# Patient Record
Sex: Female | Born: 1945 | Race: White | Hispanic: No
Health system: Southern US, Community
[De-identification: ages and names within clinical notes are randomized; demographics above are authoritative.]

## PROBLEM LIST (undated history)

## (undated) HISTORY — PX: AUGMENTATION MAMMAPLASTY: SUR837

---

## 1997-03-01 ENCOUNTER — Ambulatory Visit (HOSPITAL_COMMUNITY): Admission: RE | Admit: 1997-03-01 | Discharge: 1997-03-01 | Payer: Self-pay | Admitting: Obstetrics & Gynecology

## 1997-07-26 ENCOUNTER — Encounter: Admission: RE | Admit: 1997-07-26 | Discharge: 1997-10-24 | Payer: Self-pay | Admitting: Internal Medicine

## 1998-07-04 ENCOUNTER — Ambulatory Visit (HOSPITAL_COMMUNITY): Admission: RE | Admit: 1998-07-04 | Discharge: 1998-07-04 | Payer: Self-pay | Admitting: Internal Medicine

## 1998-07-04 ENCOUNTER — Encounter: Payer: Self-pay | Admitting: Internal Medicine

## 1998-07-08 ENCOUNTER — Encounter: Payer: Self-pay | Admitting: Internal Medicine

## 1998-07-08 ENCOUNTER — Ambulatory Visit (HOSPITAL_COMMUNITY): Admission: RE | Admit: 1998-07-08 | Discharge: 1998-07-08 | Payer: Self-pay | Admitting: Internal Medicine

## 1998-12-26 ENCOUNTER — Other Ambulatory Visit: Admission: RE | Admit: 1998-12-26 | Discharge: 1998-12-26 | Payer: Self-pay | Admitting: Obstetrics & Gynecology

## 1999-01-27 ENCOUNTER — Ambulatory Visit (HOSPITAL_COMMUNITY): Admission: RE | Admit: 1999-01-27 | Discharge: 1999-01-27 | Payer: Self-pay | Admitting: Internal Medicine

## 1999-01-27 ENCOUNTER — Encounter: Payer: Self-pay | Admitting: Internal Medicine

## 1999-04-25 ENCOUNTER — Emergency Department (HOSPITAL_COMMUNITY): Admission: EM | Admit: 1999-04-25 | Discharge: 1999-04-25 | Payer: Self-pay | Admitting: Emergency Medicine

## 1999-04-26 ENCOUNTER — Encounter: Payer: Self-pay | Admitting: Emergency Medicine

## 1999-05-22 ENCOUNTER — Ambulatory Visit (HOSPITAL_COMMUNITY): Admission: RE | Admit: 1999-05-22 | Discharge: 1999-05-22 | Payer: Self-pay | Admitting: Obstetrics & Gynecology

## 1999-05-22 ENCOUNTER — Encounter (INDEPENDENT_AMBULATORY_CARE_PROVIDER_SITE_OTHER): Payer: Self-pay | Admitting: Specialist

## 1999-07-25 ENCOUNTER — Encounter: Admission: RE | Admit: 1999-07-25 | Discharge: 1999-07-25 | Payer: Self-pay | Admitting: Internal Medicine

## 1999-07-25 ENCOUNTER — Encounter: Payer: Self-pay | Admitting: Internal Medicine

## 1999-08-29 ENCOUNTER — Other Ambulatory Visit: Admission: RE | Admit: 1999-08-29 | Discharge: 1999-08-29 | Payer: Self-pay | Admitting: Obstetrics & Gynecology

## 1999-08-29 ENCOUNTER — Encounter (INDEPENDENT_AMBULATORY_CARE_PROVIDER_SITE_OTHER): Payer: Self-pay | Admitting: Specialist

## 2000-01-17 ENCOUNTER — Other Ambulatory Visit: Admission: RE | Admit: 2000-01-17 | Discharge: 2000-01-17 | Payer: Self-pay | Admitting: Obstetrics & Gynecology

## 2000-08-07 ENCOUNTER — Ambulatory Visit (HOSPITAL_COMMUNITY): Admission: RE | Admit: 2000-08-07 | Discharge: 2000-08-07 | Payer: Self-pay | Admitting: Obstetrics & Gynecology

## 2000-08-07 ENCOUNTER — Encounter: Payer: Self-pay | Admitting: Obstetrics & Gynecology

## 2000-10-29 ENCOUNTER — Encounter: Admission: RE | Admit: 2000-10-29 | Discharge: 2000-10-29 | Payer: Self-pay | Admitting: Internal Medicine

## 2000-10-29 ENCOUNTER — Encounter: Payer: Self-pay | Admitting: Internal Medicine

## 2001-01-31 ENCOUNTER — Other Ambulatory Visit: Admission: RE | Admit: 2001-01-31 | Discharge: 2001-01-31 | Payer: Self-pay | Admitting: Obstetrics & Gynecology

## 2001-10-24 ENCOUNTER — Ambulatory Visit (HOSPITAL_COMMUNITY): Admission: RE | Admit: 2001-10-24 | Discharge: 2001-10-24 | Payer: Self-pay | Admitting: Obstetrics & Gynecology

## 2001-10-24 ENCOUNTER — Encounter: Payer: Self-pay | Admitting: Obstetrics & Gynecology

## 2002-01-20 ENCOUNTER — Other Ambulatory Visit: Admission: RE | Admit: 2002-01-20 | Discharge: 2002-01-20 | Payer: Self-pay | Admitting: Obstetrics & Gynecology

## 2002-10-27 ENCOUNTER — Ambulatory Visit (HOSPITAL_COMMUNITY): Admission: RE | Admit: 2002-10-27 | Discharge: 2002-10-27 | Payer: Self-pay | Admitting: Obstetrics & Gynecology

## 2002-10-27 ENCOUNTER — Encounter: Payer: Self-pay | Admitting: Obstetrics & Gynecology

## 2003-02-19 ENCOUNTER — Other Ambulatory Visit: Admission: RE | Admit: 2003-02-19 | Discharge: 2003-02-19 | Payer: Self-pay | Admitting: Obstetrics & Gynecology

## 2003-03-03 ENCOUNTER — Ambulatory Visit (HOSPITAL_BASED_OUTPATIENT_CLINIC_OR_DEPARTMENT_OTHER): Admission: RE | Admit: 2003-03-03 | Discharge: 2003-03-03 | Payer: Self-pay | Admitting: Orthopedic Surgery

## 2003-03-03 ENCOUNTER — Ambulatory Visit (HOSPITAL_COMMUNITY): Admission: RE | Admit: 2003-03-03 | Discharge: 2003-03-03 | Payer: Self-pay | Admitting: Orthopedic Surgery

## 2003-11-18 ENCOUNTER — Ambulatory Visit (HOSPITAL_COMMUNITY): Admission: RE | Admit: 2003-11-18 | Discharge: 2003-11-18 | Payer: Self-pay | Admitting: Obstetrics & Gynecology

## 2004-04-06 ENCOUNTER — Other Ambulatory Visit: Admission: RE | Admit: 2004-04-06 | Discharge: 2004-04-06 | Payer: Self-pay | Admitting: Obstetrics & Gynecology

## 2004-11-22 ENCOUNTER — Ambulatory Visit (HOSPITAL_COMMUNITY): Admission: RE | Admit: 2004-11-22 | Discharge: 2004-11-22 | Payer: Self-pay | Admitting: Obstetrics & Gynecology

## 2004-12-11 ENCOUNTER — Ambulatory Visit (HOSPITAL_COMMUNITY): Admission: RE | Admit: 2004-12-11 | Discharge: 2004-12-11 | Payer: Self-pay | Admitting: Plastic Surgery

## 2004-12-11 ENCOUNTER — Ambulatory Visit (HOSPITAL_BASED_OUTPATIENT_CLINIC_OR_DEPARTMENT_OTHER): Admission: RE | Admit: 2004-12-11 | Discharge: 2004-12-11 | Payer: Self-pay | Admitting: Plastic Surgery

## 2004-12-25 ENCOUNTER — Encounter: Admission: RE | Admit: 2004-12-25 | Discharge: 2004-12-25 | Payer: Self-pay | Admitting: Obstetrics & Gynecology

## 2005-05-01 ENCOUNTER — Other Ambulatory Visit: Admission: RE | Admit: 2005-05-01 | Discharge: 2005-05-01 | Payer: Self-pay | Admitting: Obstetrics & Gynecology

## 2005-12-24 ENCOUNTER — Encounter: Admission: RE | Admit: 2005-12-24 | Discharge: 2006-03-24 | Payer: Self-pay | Admitting: Family Medicine

## 2006-01-24 ENCOUNTER — Ambulatory Visit (HOSPITAL_COMMUNITY): Admission: RE | Admit: 2006-01-24 | Discharge: 2006-01-24 | Payer: Self-pay | Admitting: Obstetrics & Gynecology

## 2007-03-20 ENCOUNTER — Ambulatory Visit (HOSPITAL_COMMUNITY): Admission: RE | Admit: 2007-03-20 | Discharge: 2007-03-20 | Payer: Self-pay | Admitting: Obstetrics & Gynecology

## 2007-04-01 ENCOUNTER — Encounter: Admission: RE | Admit: 2007-04-01 | Discharge: 2007-04-01 | Payer: Self-pay | Admitting: Obstetrics & Gynecology

## 2008-04-15 ENCOUNTER — Encounter: Admission: RE | Admit: 2008-04-15 | Discharge: 2008-04-15 | Payer: Self-pay | Admitting: Obstetrics & Gynecology

## 2009-04-18 ENCOUNTER — Ambulatory Visit (HOSPITAL_COMMUNITY): Admission: RE | Admit: 2009-04-18 | Discharge: 2009-04-18 | Payer: Self-pay | Admitting: Family Medicine

## 2010-02-04 ENCOUNTER — Encounter: Payer: Self-pay | Admitting: Obstetrics & Gynecology

## 2010-02-05 ENCOUNTER — Encounter: Payer: Self-pay | Admitting: Obstetrics & Gynecology

## 2010-03-01 ENCOUNTER — Other Ambulatory Visit (HOSPITAL_COMMUNITY): Payer: Self-pay | Admitting: Family Medicine

## 2010-03-01 DIAGNOSIS — Z1231 Encounter for screening mammogram for malignant neoplasm of breast: Secondary | ICD-10-CM

## 2010-04-26 ENCOUNTER — Ambulatory Visit (HOSPITAL_COMMUNITY): Payer: Medicare Other

## 2010-05-04 ENCOUNTER — Other Ambulatory Visit: Payer: Self-pay | Admitting: Family Medicine

## 2010-05-04 DIAGNOSIS — Z1231 Encounter for screening mammogram for malignant neoplasm of breast: Secondary | ICD-10-CM

## 2010-05-09 ENCOUNTER — Ambulatory Visit (HOSPITAL_COMMUNITY): Payer: Medicare Other

## 2010-05-17 ENCOUNTER — Ambulatory Visit: Payer: Medicare Other

## 2010-05-23 ENCOUNTER — Ambulatory Visit
Admission: RE | Admit: 2010-05-23 | Discharge: 2010-05-23 | Disposition: A | Discharge status: Home / Self Care | Payer: Medicare Other | Source: Ambulatory Visit | Attending: Family Medicine | Admitting: Family Medicine

## 2010-05-23 DIAGNOSIS — Z1231 Encounter for screening mammogram for malignant neoplasm of breast: Secondary | ICD-10-CM

## 2010-06-02 NOTE — Op Note (Signed)
[facility]  Patient:    Lauren Kerr, Lauren Kerr                       MRN: [id number] Proc. Date: 05/22/99 Adm. Date:  [id number] Attending:  Minette Headland                           Operative Report  PREOPERATIVE DIAGNOSIS:       Uterine fibroids, endometrial thickening on pelvic ultrasound, postmenopausal bleeding on hormone replacement therapy.  POSTOPERATIVE DIAGNOSIS:      Submucosal fibroids broad based not easily resectable and endometrial polyps.  OPERATION:                    Hysteroscopy, D&C.  SURGEON:                      Freddy Finner, M.D.  ASSISTANT:  ANESTHESIA:                   General anesthesia.  COMPLICATIONS:                None.  ESTIMATED BLOOD LOSS:         10 cc.  SORBITOL:                     Deficit 90 cc.  INDICATIONS:                  The patient is a 65 year old, gravida 3, para 3, ho has been known to have uterine leiomyomata and had an episode of postmenopausal  bleeding on hormone replacement therapy.  Pelvic ultrasound suggested submucous  fibroids, but also showed thickened endometrium to 1.2 cm.  She is admitted now for hysteroscopy and D&C.  FINDINGS:                     Did reveal an irregularity of the internal contour of the uterine cavity with a broad based area anteriorly consistent with a polyp and with a submucous fibroid.  The polyp was broad based and thickened to approximately 1 cm.  Posteriorly there appeared to be an irregularity on the posterior fundus. There was also a small polyp in that location.  Thorough curettage, explortion ith Randall stone forceps, and repeat curettage did adequately evacuate the polyps nd reduce the size of the irregularities noted on hysteroscopy.  DESCRIPTION OF PROCEDURE:     The patient was brought to the operating room and  placed under adequate general anesthesia.  She was placed in the dorsal lithotomy position using the [employer] system.   Betadine prep was carried out in the usual fashion.  Sterile drapes were applied.  The 12 degree [facility] hysteroscope was used, using Sorbitol as the distending medium.  The cervix was progressively dilated to 25 with Pratts.  Hysteroscope was introduced and careful inspection revealed findings as noted above.  Thorough curettage was carried out with Heaney curet and with standard sharp curet and the uterine cavity was explored with Randall stone forceps.  Tissue was obtained for histologic examination.  On reinspection revealed adequate sampling and reduction in the size of the lesions noted.  The procedure at this point was terminated.  All instruments were removed. The patient was taken to the recovery room in good condition and will be discharged in the immediate postoperative period with routine outpatient surgical instructions.  She is to use ibuprofen as needed for cramping pain. DD:  05/22/99 TD:  05/23/99 Job: [id number]

## 2010-06-02 NOTE — Op Note (Signed)
NAME:  Lauren Kerr, Lauren Kerr                          ACCOUNT NO.:  [id number]   MEDICAL RECORD NO.:  [id number]                   PATIENT TYPE:  AMB   LOCATION:  DSC                                  FACILITY:  [facility]   PHYSICIAN:  Harvie Junior, M.D.                DATE OF BIRTH:  May 18, 1945   DATE OF PROCEDURE:  03/03/2003  DATE OF DISCHARGE:                                 OPERATIVE REPORT   PREOPERATIVE DIAGNOSIS:  Stiff left shoulder with impingement.   POSTOPERATIVE DIAGNOSIS:  Stiff left shoulder with impingement.   PROCEDURE:  Anterolateral acromioplasty with manipulation under anesthesia.   SURGEON:  Harvie Junior, M.D.   ASSISTANT:  Marshia Ly, P.A.   ANESTHESIA:  General.   INDICATIONS FOR PROCEDURE:  A 64 year old female with a long history of  having left shoulder pain and locking and freezing up.  Ultimately had taken  her through physical therapy and anti-inflammatory medications.  She failed  all conservative care and with continued complaints of pain, she was  ultimately taken to the operating room for manipulation under anesthesia and  arthroscopy.   DESCRIPTION OF PROCEDURE:  The patient was taken to the operating room and  after adequate anesthesia was obtained with general endotracheal, the  patient was placed supine on the table. The left arm was examined under  anesthesia and noted to be stiff.  We manipulated her to 170 degrees  elevation to the back of her neck and also an external rotation to about 60  degrees.  At this point, she was prepped and draped in the usual sterile  fashion.  Following this, routine arthroscopic examination of the shoulder  revealed that there was tearing of the structures within the glenohumeral  joint and no other significant abnormality.  Rotator cuff looked good.  At  this point, attention was turned to the subacromial space where an  anterolateral acromioplasty was performed through a lateral and posterior  incision.  At  this point, the shoulder was copiously irrigated and suctioned  dry.  The arthroscopic portals were closed with a bandage.  A sterile  compression dressing was applied.  The patient was taken to the recovery  room where she was noted to be in satisfactory condition.  Estimated blood  loss for the procedure was none.                                               Harvie Junior, M.D.    Ranae Plumber  D:  03/03/2003  T:  03/04/2003  Job:  [id number]

## 2010-06-02 NOTE — Op Note (Signed)
Lauren Kerr, Lauren Kerr                ACCOUNT NO.:  [id number]   MEDICAL RECORD NO.:  [id number]          PATIENT TYPE:  AMB   LOCATION:  DSC                          FACILITY:  [facility]   PHYSICIAN:  Alfredia Ferguson, M.D.  DATE OF BIRTH:  1945/06/08   DATE OF PROCEDURE:  12/11/2004  DATE OF DISCHARGE:                                 OPERATIVE REPORT   PREOPERATIVE DIAGNOSES:  1.  Excess fat, submental and bilateral neck areas.  2.  Dermatochalasia of upper eyelid.  3.  Visual field loss secondary to number two.  4.  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   POSTOPERATIVE DIAGNOSES:  1.  Excess fat, submental and bilateral neck areas.  2.  Dermatochalasia of upper eyelid.  3.  Visual field loss secondary to number two.  4.  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   OPERATION PERFORMED:   SURGEON:  Alfredia Ferguson, MD.   ANESTHESIA:  General endotracheal anesthesia.   INDICATIONS FOR SURGERY:  This 65 year old woman complains of excess fat in  her neck.  She wishes to have the fat removed by suction.  She understands  the risk of the skin not contracting and leaving with her excess, redundant  skin in the area.  She also understands the limitations of liposuction in  someone over 40 with the possibility of rippling, dimpling, and wrinkling of  the skin being at greater likelihood.  The patient also complains of visual  field loss secondary to dermatochalasia of her upper eyelid.  She wishes to  undergo elective upper and lower lid blepharoplasty.  Potential risk of  blepharoplasty including blindness, bleeding, hematoma, asymmetry, unsightly  scarring, ectropion, the need for redo surgery, and overall dissatisfaction  of the results were discussed with the patient.  In spite of these and other  risks discussed, the patient wishes to proceed with the surgery.   DESCRIPTION OF  OPERATION:  Skin markers were placed outlining the dimensions  of the excess skin of the upper eyelids.  Skin markers were also placed  along the subciliary margin in both lower eyelids.  The limits of my  liposuction were marked prior to the patient being taken to the OR.  Patient  was then taken to the OR where she was given general endotracheal  anesthesia.  Her face and neck were prepped with Betadine and draped with  sterile drapes.  A tumescent solution was infiltrated in the submental area  in both sides of the neck. A 150 mL of this tumescent solution was used.  This solution consisted of 250 mL of normal saline plus 1 ampule of  epinephrine 1:1000 plus 50 mL of 1% Xylocaine plain.  1% Xylocaine and  1:1000 epinephrine were also infiltrated in both the upper and lower eyelid  areas.  Using a combination of a 3-mm and a 3.4-mm cannula, liposuction was  carried out first in the submental area and then in both the lateral necks.  The access was  a small incision in the submental area and one small incision  behind each earlobe.  75 gm of separated fat was removed.  There was  essentially no bleeding.  The patient had a nice clinical response.  There  was noted to be fat continuing on down lower in her neck, but I opted not to  continue to chase it any farther than what I preoperatively marked her.  Attention was now directed to the right upper eyelid.  An elliptical skin  excision in the upper lid was carried out.  A narrow strip of orbicularis  muscle was then excised.  Hemostasis was meticulously maintained.  The  septum orbitale was opened medially and in the middle, and a moderate amount  of fat was teased out of its anatomic bed.  The fat was clamped at the base,  excised, and the base was cauterized.  A small tunnel was then created  underneath the orbicularis in the lateral orbital rim area through the upper  eyelid incision.  This was tunneled for about a centimeter.  The  lateral  limb of my lower eyelid incision was now incised.  A skin flap was elevated  in the subciliary area.  A skin flap was elevated for 5 to 6 mm.  At this  point, the depth of my incision went underneath the orbicularis muscle.  This skin muscle flap continued down to the inferior orbital rim.  The  lateral corner of the orbicularis was incised in order to allow me to  elevate the flap to the desired degree.  A large amount of fat was  visualized in the lower eyelid.  The septum orbitale was opened medially in  the middle and in the lateral pocket, and fat was teased out of its anatomic  bed.  In each area, the fat was clamped at the base, excised, and the base  was cauterized.  The wound was irrigated with saline irrigation.  The skin  muscle flap was elevated in a superior direction, and a conservative skin  excision was carried out.  A small pennant of muscle was removed from the  undersurface of the lateral corner of my lower lid incision.  I connected  the lower eyelid incision with the lateral tunnel, which I created from the  upper eyelid.  This small pennant of muscle was then pulled up through this  tunnel and was tacked down to the lateral orbital rim with an interrupted 4-  0 Vicryl suture.  This was placed to support the lower lid.  Some small  revision was then carried out in the lower eyelid, removing an extra  millimeter of skin.  Before closing either of my incisions, attention was  directed to the left side where an identical procedure was performed.  Once  I got to the same point on the left side as I was on the right, I returned  to the right side where both incisions were irrigated with saline  irrigation.  The upper eyelid incision was closed with several interrupted 6-  0 nylon sutures spaced approximately a centimeter or two apart.  A running 5-  0 plain gut was used to close the upper eyelid incision in its entirety. The lateral corner of my lower eyelid  incision was closed with interrupted 6-  0 silk sutures.  The remainder of the medial three-fourths of the incision  was closed with a running 5-0 fast-absorbing gut.  Attention was redirected  to the left side where  it was irrigated, and closure was carried out in an  identical fashion.  The patient tolerated the procedure well with almost no  bleeding.  Each access site in the neck was now closed with interrupted 5-0  nylon suture.  The patient had two small skin tags removed from each  axillary area at her request.  They were simply snipped off at the base and  a Band-Aid was placed.  The patient was awakened, extubated, and transported  to the recovery room in satisfactory condition.      Alfredia Ferguson, M.D.  Electronically Signed     WBB/MEDQ  D:  12/11/2004  T:  12/12/2004  Job:  [id number]

## 2011-03-09 DIAGNOSIS — M19049 Primary osteoarthritis, unspecified hand: Secondary | ICD-10-CM | POA: Diagnosis not present

## 2011-03-15 DIAGNOSIS — E7800 Pure hypercholesterolemia, unspecified: Secondary | ICD-10-CM | POA: Diagnosis not present

## 2011-03-15 DIAGNOSIS — E1149 Type 2 diabetes mellitus with other diabetic neurological complication: Secondary | ICD-10-CM | POA: Diagnosis not present

## 2011-03-15 DIAGNOSIS — I10 Essential (primary) hypertension: Secondary | ICD-10-CM | POA: Diagnosis not present

## 2011-03-15 DIAGNOSIS — G609 Hereditary and idiopathic neuropathy, unspecified: Secondary | ICD-10-CM | POA: Diagnosis not present

## 2011-04-24 DIAGNOSIS — N950 Postmenopausal bleeding: Secondary | ICD-10-CM | POA: Diagnosis not present

## 2011-04-24 DIAGNOSIS — N949 Unspecified condition associated with female genital organs and menstrual cycle: Secondary | ICD-10-CM | POA: Diagnosis not present

## 2011-04-24 DIAGNOSIS — N8500 Endometrial hyperplasia, unspecified: Secondary | ICD-10-CM | POA: Diagnosis not present

## 2011-04-26 DIAGNOSIS — N950 Postmenopausal bleeding: Secondary | ICD-10-CM | POA: Diagnosis not present

## 2011-04-26 DIAGNOSIS — R9389 Abnormal findings on diagnostic imaging of other specified body structures: Secondary | ICD-10-CM | POA: Diagnosis not present

## 2011-04-26 DIAGNOSIS — Z79890 Hormone replacement therapy (postmenopausal): Secondary | ICD-10-CM | POA: Diagnosis not present

## 2011-04-26 DIAGNOSIS — D260 Other benign neoplasm of cervix uteri: Secondary | ICD-10-CM | POA: Diagnosis not present

## 2011-05-01 DIAGNOSIS — M19049 Primary osteoarthritis, unspecified hand: Secondary | ICD-10-CM | POA: Diagnosis not present

## 2011-05-09 ENCOUNTER — Other Ambulatory Visit (HOSPITAL_COMMUNITY): Payer: Self-pay | Admitting: Family Medicine

## 2011-05-09 DIAGNOSIS — Z1231 Encounter for screening mammogram for malignant neoplasm of breast: Secondary | ICD-10-CM

## 2011-06-06 ENCOUNTER — Ambulatory Visit
Admission: RE | Admit: 2011-06-06 | Discharge: 2011-06-06 | Disposition: A | Discharge status: Home / Self Care | Payer: Medicare Other | Source: Ambulatory Visit | Attending: Family Medicine | Admitting: Family Medicine

## 2011-06-06 DIAGNOSIS — Z1231 Encounter for screening mammogram for malignant neoplasm of breast: Secondary | ICD-10-CM

## 2011-07-31 DIAGNOSIS — H6120 Impacted cerumen, unspecified ear: Secondary | ICD-10-CM | POA: Diagnosis not present

## 2011-07-31 DIAGNOSIS — E1149 Type 2 diabetes mellitus with other diabetic neurological complication: Secondary | ICD-10-CM | POA: Diagnosis not present

## 2011-08-16 DIAGNOSIS — H259 Unspecified age-related cataract: Secondary | ICD-10-CM | POA: Diagnosis not present

## 2011-08-16 DIAGNOSIS — E119 Type 2 diabetes mellitus without complications: Secondary | ICD-10-CM | POA: Diagnosis not present

## 2011-08-16 DIAGNOSIS — Z961 Presence of intraocular lens: Secondary | ICD-10-CM | POA: Diagnosis not present

## 2011-08-16 DIAGNOSIS — H5231 Anisometropia: Secondary | ICD-10-CM | POA: Diagnosis not present

## 2011-08-28 DIAGNOSIS — D692 Other nonthrombocytopenic purpura: Secondary | ICD-10-CM | POA: Diagnosis not present

## 2011-08-28 DIAGNOSIS — L570 Actinic keratosis: Secondary | ICD-10-CM | POA: Diagnosis not present

## 2011-08-28 DIAGNOSIS — L821 Other seborrheic keratosis: Secondary | ICD-10-CM | POA: Diagnosis not present

## 2011-08-28 DIAGNOSIS — Z85828 Personal history of other malignant neoplasm of skin: Secondary | ICD-10-CM | POA: Diagnosis not present

## 2011-09-12 DIAGNOSIS — G609 Hereditary and idiopathic neuropathy, unspecified: Secondary | ICD-10-CM | POA: Diagnosis not present

## 2011-09-12 DIAGNOSIS — Z0000 Encounter for general adult medical examination without abnormal findings: Secondary | ICD-10-CM | POA: Diagnosis not present

## 2011-09-12 DIAGNOSIS — F329 Major depressive disorder, single episode, unspecified: Secondary | ICD-10-CM | POA: Diagnosis not present

## 2011-09-12 DIAGNOSIS — E7800 Pure hypercholesterolemia, unspecified: Secondary | ICD-10-CM | POA: Diagnosis not present

## 2011-09-12 DIAGNOSIS — I10 Essential (primary) hypertension: Secondary | ICD-10-CM | POA: Diagnosis not present

## 2011-09-12 DIAGNOSIS — J309 Allergic rhinitis, unspecified: Secondary | ICD-10-CM | POA: Diagnosis not present

## 2011-09-12 DIAGNOSIS — E1149 Type 2 diabetes mellitus with other diabetic neurological complication: Secondary | ICD-10-CM | POA: Diagnosis not present

## 2011-10-08 DIAGNOSIS — Z01419 Encounter for gynecological examination (general) (routine) without abnormal findings: Secondary | ICD-10-CM | POA: Diagnosis not present

## 2011-10-30 DIAGNOSIS — Z780 Asymptomatic menopausal state: Secondary | ICD-10-CM | POA: Diagnosis not present

## 2011-11-08 DIAGNOSIS — M2550 Pain in unspecified joint: Secondary | ICD-10-CM | POA: Diagnosis not present

## 2011-11-08 DIAGNOSIS — R5381 Other malaise: Secondary | ICD-10-CM | POA: Diagnosis not present

## 2011-11-08 DIAGNOSIS — M25569 Pain in unspecified knee: Secondary | ICD-10-CM | POA: Diagnosis not present

## 2011-11-08 DIAGNOSIS — R5383 Other fatigue: Secondary | ICD-10-CM | POA: Diagnosis not present

## 2011-11-08 DIAGNOSIS — Z79899 Other long term (current) drug therapy: Secondary | ICD-10-CM | POA: Diagnosis not present

## 2011-11-08 DIAGNOSIS — M25549 Pain in joints of unspecified hand: Secondary | ICD-10-CM | POA: Diagnosis not present

## 2011-11-29 DIAGNOSIS — M19049 Primary osteoarthritis, unspecified hand: Secondary | ICD-10-CM | POA: Diagnosis not present

## 2011-11-29 DIAGNOSIS — M1710 Unilateral primary osteoarthritis, unspecified knee: Secondary | ICD-10-CM | POA: Diagnosis not present

## 2012-04-01 DIAGNOSIS — N949 Unspecified condition associated with female genital organs and menstrual cycle: Secondary | ICD-10-CM | POA: Diagnosis not present

## 2012-05-06 DIAGNOSIS — N800 Endometriosis of uterus: Secondary | ICD-10-CM | POA: Diagnosis not present

## 2012-05-06 DIAGNOSIS — N949 Unspecified condition associated with female genital organs and menstrual cycle: Secondary | ICD-10-CM | POA: Diagnosis not present

## 2012-05-14 ENCOUNTER — Other Ambulatory Visit: Payer: Self-pay

## 2012-05-14 DIAGNOSIS — Z1231 Encounter for screening mammogram for malignant neoplasm of breast: Secondary | ICD-10-CM

## 2012-05-19 DIAGNOSIS — D279 Benign neoplasm of unspecified ovary: Secondary | ICD-10-CM | POA: Diagnosis not present

## 2012-05-19 DIAGNOSIS — N949 Unspecified condition associated with female genital organs and menstrual cycle: Secondary | ICD-10-CM | POA: Diagnosis not present

## 2012-05-19 DIAGNOSIS — N838 Other noninflammatory disorders of ovary, fallopian tube and broad ligament: Secondary | ICD-10-CM | POA: Diagnosis not present

## 2012-05-19 DIAGNOSIS — D282 Benign neoplasm of uterine tubes and ligaments: Secondary | ICD-10-CM | POA: Diagnosis not present

## 2012-05-19 DIAGNOSIS — N736 Female pelvic peritoneal adhesions (postinfective): Secondary | ICD-10-CM | POA: Diagnosis not present

## 2012-05-19 DIAGNOSIS — N9489 Other specified conditions associated with female genital organs and menstrual cycle: Secondary | ICD-10-CM | POA: Diagnosis not present

## 2012-05-19 DIAGNOSIS — N800 Endometriosis of uterus: Secondary | ICD-10-CM | POA: Diagnosis not present

## 2012-05-19 DIAGNOSIS — N840 Polyp of corpus uteri: Secondary | ICD-10-CM | POA: Diagnosis not present

## 2012-05-19 DIAGNOSIS — D259 Leiomyoma of uterus, unspecified: Secondary | ICD-10-CM | POA: Diagnosis not present

## 2012-05-28 DIAGNOSIS — M1710 Unilateral primary osteoarthritis, unspecified knee: Secondary | ICD-10-CM | POA: Diagnosis not present

## 2012-05-28 DIAGNOSIS — M4000 Postural kyphosis, site unspecified: Secondary | ICD-10-CM | POA: Diagnosis not present

## 2012-05-28 DIAGNOSIS — M19049 Primary osteoarthritis, unspecified hand: Secondary | ICD-10-CM | POA: Diagnosis not present

## 2012-06-26 DIAGNOSIS — Z09 Encounter for follow-up examination after completed treatment for conditions other than malignant neoplasm: Secondary | ICD-10-CM | POA: Diagnosis not present

## 2012-07-28 ENCOUNTER — Ambulatory Visit
Admission: RE | Admit: 2012-07-28 | Discharge: 2012-07-28 | Disposition: A | Discharge status: Home / Self Care | Payer: Medicare Other | Source: Ambulatory Visit

## 2012-07-28 DIAGNOSIS — Z1231 Encounter for screening mammogram for malignant neoplasm of breast: Secondary | ICD-10-CM

## 2012-08-28 DIAGNOSIS — H43819 Vitreous degeneration, unspecified eye: Secondary | ICD-10-CM | POA: Diagnosis not present

## 2012-08-28 DIAGNOSIS — H259 Unspecified age-related cataract: Secondary | ICD-10-CM | POA: Diagnosis not present

## 2012-08-28 DIAGNOSIS — E119 Type 2 diabetes mellitus without complications: Secondary | ICD-10-CM | POA: Diagnosis not present

## 2012-08-28 DIAGNOSIS — H5231 Anisometropia: Secondary | ICD-10-CM | POA: Diagnosis not present

## 2012-09-23 DIAGNOSIS — J309 Allergic rhinitis, unspecified: Secondary | ICD-10-CM | POA: Diagnosis not present

## 2012-09-23 DIAGNOSIS — I10 Essential (primary) hypertension: Secondary | ICD-10-CM | POA: Diagnosis not present

## 2012-09-23 DIAGNOSIS — E1149 Type 2 diabetes mellitus with other diabetic neurological complication: Secondary | ICD-10-CM | POA: Diagnosis not present

## 2012-09-23 DIAGNOSIS — G609 Hereditary and idiopathic neuropathy, unspecified: Secondary | ICD-10-CM | POA: Diagnosis not present

## 2012-09-23 DIAGNOSIS — Z0000 Encounter for general adult medical examination without abnormal findings: Secondary | ICD-10-CM | POA: Diagnosis not present

## 2012-09-23 DIAGNOSIS — F329 Major depressive disorder, single episode, unspecified: Secondary | ICD-10-CM | POA: Diagnosis not present

## 2012-09-23 DIAGNOSIS — Z23 Encounter for immunization: Secondary | ICD-10-CM | POA: Diagnosis not present

## 2012-09-23 DIAGNOSIS — E7800 Pure hypercholesterolemia, unspecified: Secondary | ICD-10-CM | POA: Diagnosis not present

## 2012-10-01 DIAGNOSIS — L821 Other seborrheic keratosis: Secondary | ICD-10-CM | POA: Diagnosis not present

## 2012-10-01 DIAGNOSIS — L570 Actinic keratosis: Secondary | ICD-10-CM | POA: Diagnosis not present

## 2012-10-01 DIAGNOSIS — Z85828 Personal history of other malignant neoplasm of skin: Secondary | ICD-10-CM | POA: Diagnosis not present

## 2012-10-09 DIAGNOSIS — R21 Rash and other nonspecific skin eruption: Secondary | ICD-10-CM | POA: Diagnosis not present

## 2012-11-25 DIAGNOSIS — M1710 Unilateral primary osteoarthritis, unspecified knee: Secondary | ICD-10-CM | POA: Diagnosis not present

## 2012-11-25 DIAGNOSIS — M6530 Trigger finger, unspecified finger: Secondary | ICD-10-CM | POA: Diagnosis not present

## 2012-11-25 DIAGNOSIS — M19049 Primary osteoarthritis, unspecified hand: Secondary | ICD-10-CM | POA: Diagnosis not present

## 2012-11-25 DIAGNOSIS — M4000 Postural kyphosis, site unspecified: Secondary | ICD-10-CM | POA: Diagnosis not present

## 2012-12-23 DIAGNOSIS — E119 Type 2 diabetes mellitus without complications: Secondary | ICD-10-CM | POA: Diagnosis not present

## 2013-03-24 DIAGNOSIS — G609 Hereditary and idiopathic neuropathy, unspecified: Secondary | ICD-10-CM | POA: Diagnosis not present

## 2013-03-24 DIAGNOSIS — E1149 Type 2 diabetes mellitus with other diabetic neurological complication: Secondary | ICD-10-CM | POA: Diagnosis not present

## 2013-03-24 DIAGNOSIS — I10 Essential (primary) hypertension: Secondary | ICD-10-CM | POA: Diagnosis not present

## 2013-03-24 DIAGNOSIS — Z23 Encounter for immunization: Secondary | ICD-10-CM | POA: Diagnosis not present

## 2013-03-24 DIAGNOSIS — E7800 Pure hypercholesterolemia, unspecified: Secondary | ICD-10-CM | POA: Diagnosis not present

## 2013-07-01 DIAGNOSIS — Z124 Encounter for screening for malignant neoplasm of cervix: Secondary | ICD-10-CM | POA: Diagnosis not present

## 2013-07-01 DIAGNOSIS — Z1212 Encounter for screening for malignant neoplasm of rectum: Secondary | ICD-10-CM | POA: Diagnosis not present

## 2013-08-28 ENCOUNTER — Other Ambulatory Visit: Payer: Self-pay

## 2013-08-28 DIAGNOSIS — Z1231 Encounter for screening mammogram for malignant neoplasm of breast: Secondary | ICD-10-CM

## 2013-08-28 DIAGNOSIS — Z9882 Breast implant status: Secondary | ICD-10-CM

## 2013-09-02 DIAGNOSIS — E119 Type 2 diabetes mellitus without complications: Secondary | ICD-10-CM | POA: Diagnosis not present

## 2013-09-02 DIAGNOSIS — H259 Unspecified age-related cataract: Secondary | ICD-10-CM | POA: Diagnosis not present

## 2013-09-02 DIAGNOSIS — Z961 Presence of intraocular lens: Secondary | ICD-10-CM | POA: Diagnosis not present

## 2013-09-02 DIAGNOSIS — H52209 Unspecified astigmatism, unspecified eye: Secondary | ICD-10-CM | POA: Diagnosis not present

## 2013-09-04 ENCOUNTER — Ambulatory Visit
Admission: RE | Admit: 2013-09-04 | Discharge: 2013-09-04 | Disposition: A | Discharge status: Home / Self Care | Payer: Medicare Other | Source: Ambulatory Visit

## 2013-09-04 ENCOUNTER — Encounter (INDEPENDENT_AMBULATORY_CARE_PROVIDER_SITE_OTHER): Payer: Self-pay

## 2013-09-04 DIAGNOSIS — Z1231 Encounter for screening mammogram for malignant neoplasm of breast: Secondary | ICD-10-CM

## 2013-09-04 DIAGNOSIS — Z9882 Breast implant status: Secondary | ICD-10-CM

## 2013-10-21 DIAGNOSIS — D229 Melanocytic nevi, unspecified: Secondary | ICD-10-CM | POA: Diagnosis not present

## 2013-10-21 DIAGNOSIS — L814 Other melanin hyperpigmentation: Secondary | ICD-10-CM | POA: Diagnosis not present

## 2013-10-21 DIAGNOSIS — Z85828 Personal history of other malignant neoplasm of skin: Secondary | ICD-10-CM | POA: Diagnosis not present

## 2013-10-21 DIAGNOSIS — L821 Other seborrheic keratosis: Secondary | ICD-10-CM | POA: Diagnosis not present

## 2013-10-27 DIAGNOSIS — H25011 Cortical age-related cataract, right eye: Secondary | ICD-10-CM | POA: Diagnosis not present

## 2013-10-27 DIAGNOSIS — H25041 Posterior subcapsular polar age-related cataract, right eye: Secondary | ICD-10-CM | POA: Diagnosis not present

## 2013-10-27 DIAGNOSIS — H52201 Unspecified astigmatism, right eye: Secondary | ICD-10-CM | POA: Diagnosis not present

## 2013-10-27 DIAGNOSIS — H25031 Anterior subcapsular polar age-related cataract, right eye: Secondary | ICD-10-CM | POA: Diagnosis not present

## 2013-10-27 DIAGNOSIS — H2511 Age-related nuclear cataract, right eye: Secondary | ICD-10-CM | POA: Diagnosis not present

## 2013-11-24 DIAGNOSIS — M79641 Pain in right hand: Secondary | ICD-10-CM | POA: Diagnosis not present

## 2013-11-24 DIAGNOSIS — M40209 Unspecified kyphosis, site unspecified: Secondary | ICD-10-CM | POA: Diagnosis not present

## 2013-11-24 DIAGNOSIS — M19041 Primary osteoarthritis, right hand: Secondary | ICD-10-CM | POA: Diagnosis not present

## 2013-12-01 DIAGNOSIS — M25512 Pain in left shoulder: Secondary | ICD-10-CM | POA: Diagnosis not present

## 2013-12-01 DIAGNOSIS — M542 Cervicalgia: Secondary | ICD-10-CM | POA: Diagnosis not present

## 2013-12-08 DIAGNOSIS — M542 Cervicalgia: Secondary | ICD-10-CM | POA: Diagnosis not present

## 2013-12-08 DIAGNOSIS — M25512 Pain in left shoulder: Secondary | ICD-10-CM | POA: Diagnosis not present

## 2013-12-09 DIAGNOSIS — M19041 Primary osteoarthritis, right hand: Secondary | ICD-10-CM | POA: Diagnosis not present

## 2013-12-09 DIAGNOSIS — M19042 Primary osteoarthritis, left hand: Secondary | ICD-10-CM | POA: Diagnosis not present

## 2013-12-17 DIAGNOSIS — E119 Type 2 diabetes mellitus without complications: Secondary | ICD-10-CM | POA: Diagnosis not present

## 2013-12-17 DIAGNOSIS — E780 Pure hypercholesterolemia: Secondary | ICD-10-CM | POA: Diagnosis not present

## 2013-12-17 DIAGNOSIS — Z0000 Encounter for general adult medical examination without abnormal findings: Secondary | ICD-10-CM | POA: Diagnosis not present

## 2013-12-17 DIAGNOSIS — J309 Allergic rhinitis, unspecified: Secondary | ICD-10-CM | POA: Diagnosis not present

## 2013-12-17 DIAGNOSIS — Z1389 Encounter for screening for other disorder: Secondary | ICD-10-CM | POA: Diagnosis not present

## 2013-12-17 DIAGNOSIS — I10 Essential (primary) hypertension: Secondary | ICD-10-CM | POA: Diagnosis not present

## 2013-12-17 DIAGNOSIS — Z23 Encounter for immunization: Secondary | ICD-10-CM | POA: Diagnosis not present

## 2014-03-08 DIAGNOSIS — Z01810 Encounter for preprocedural cardiovascular examination: Secondary | ICD-10-CM | POA: Diagnosis not present

## 2014-04-04 DIAGNOSIS — H6121 Impacted cerumen, right ear: Secondary | ICD-10-CM | POA: Diagnosis not present

## 2014-06-29 DIAGNOSIS — E780 Pure hypercholesterolemia: Secondary | ICD-10-CM | POA: Diagnosis not present

## 2014-06-29 DIAGNOSIS — I10 Essential (primary) hypertension: Secondary | ICD-10-CM | POA: Diagnosis not present

## 2014-06-29 DIAGNOSIS — J309 Allergic rhinitis, unspecified: Secondary | ICD-10-CM | POA: Diagnosis not present

## 2014-06-29 DIAGNOSIS — E119 Type 2 diabetes mellitus without complications: Secondary | ICD-10-CM | POA: Diagnosis not present

## 2014-06-30 DIAGNOSIS — M9901 Segmental and somatic dysfunction of cervical region: Secondary | ICD-10-CM | POA: Diagnosis not present

## 2014-06-30 DIAGNOSIS — M542 Cervicalgia: Secondary | ICD-10-CM | POA: Diagnosis not present

## 2014-07-02 DIAGNOSIS — M542 Cervicalgia: Secondary | ICD-10-CM | POA: Diagnosis not present

## 2014-07-02 DIAGNOSIS — M9901 Segmental and somatic dysfunction of cervical region: Secondary | ICD-10-CM | POA: Diagnosis not present

## 2014-07-05 DIAGNOSIS — M9902 Segmental and somatic dysfunction of thoracic region: Secondary | ICD-10-CM | POA: Diagnosis not present

## 2014-07-05 DIAGNOSIS — M62838 Other muscle spasm: Secondary | ICD-10-CM | POA: Diagnosis not present

## 2014-07-06 DIAGNOSIS — Z01419 Encounter for gynecological examination (general) (routine) without abnormal findings: Secondary | ICD-10-CM | POA: Diagnosis not present

## 2014-07-06 DIAGNOSIS — Z6825 Body mass index (BMI) 25.0-25.9, adult: Secondary | ICD-10-CM | POA: Diagnosis not present

## 2014-10-08 ENCOUNTER — Other Ambulatory Visit: Payer: Self-pay

## 2014-10-08 DIAGNOSIS — Z1231 Encounter for screening mammogram for malignant neoplasm of breast: Secondary | ICD-10-CM

## 2014-10-25 DIAGNOSIS — Z23 Encounter for immunization: Secondary | ICD-10-CM | POA: Diagnosis not present

## 2014-11-10 ENCOUNTER — Ambulatory Visit: Payer: Medicare Other

## 2014-11-10 DIAGNOSIS — L821 Other seborrheic keratosis: Secondary | ICD-10-CM | POA: Diagnosis not present

## 2014-11-10 DIAGNOSIS — Z85828 Personal history of other malignant neoplasm of skin: Secondary | ICD-10-CM | POA: Diagnosis not present

## 2014-11-10 DIAGNOSIS — L814 Other melanin hyperpigmentation: Secondary | ICD-10-CM | POA: Diagnosis not present

## 2014-11-10 DIAGNOSIS — L03019 Cellulitis of unspecified finger: Secondary | ICD-10-CM | POA: Diagnosis not present

## 2014-11-10 DIAGNOSIS — L609 Nail disorder, unspecified: Secondary | ICD-10-CM | POA: Diagnosis not present

## 2014-11-10 DIAGNOSIS — D225 Melanocytic nevi of trunk: Secondary | ICD-10-CM | POA: Diagnosis not present

## 2014-11-23 DIAGNOSIS — N644 Mastodynia: Secondary | ICD-10-CM | POA: Diagnosis not present

## 2014-11-23 DIAGNOSIS — N3945 Continuous leakage: Secondary | ICD-10-CM | POA: Diagnosis not present

## 2014-12-15 ENCOUNTER — Ambulatory Visit: Payer: Medicare Other

## 2014-12-16 ENCOUNTER — Ambulatory Visit
Admission: RE | Admit: 2014-12-16 | Discharge: 2014-12-16 | Disposition: A | Discharge status: Home / Self Care | Payer: Medicare Other | Source: Ambulatory Visit

## 2014-12-16 DIAGNOSIS — Z1231 Encounter for screening mammogram for malignant neoplasm of breast: Secondary | ICD-10-CM | POA: Diagnosis not present

## 2014-12-23 DIAGNOSIS — Z0000 Encounter for general adult medical examination without abnormal findings: Secondary | ICD-10-CM | POA: Diagnosis not present

## 2014-12-23 DIAGNOSIS — J309 Allergic rhinitis, unspecified: Secondary | ICD-10-CM | POA: Diagnosis not present

## 2014-12-23 DIAGNOSIS — Z7984 Long term (current) use of oral hypoglycemic drugs: Secondary | ICD-10-CM | POA: Diagnosis not present

## 2014-12-23 DIAGNOSIS — Z1389 Encounter for screening for other disorder: Secondary | ICD-10-CM | POA: Diagnosis not present

## 2014-12-23 DIAGNOSIS — E7800 Pure hypercholesterolemia, unspecified: Secondary | ICD-10-CM | POA: Diagnosis not present

## 2014-12-23 DIAGNOSIS — E119 Type 2 diabetes mellitus without complications: Secondary | ICD-10-CM | POA: Diagnosis not present

## 2014-12-23 DIAGNOSIS — I10 Essential (primary) hypertension: Secondary | ICD-10-CM | POA: Diagnosis not present

## 2014-12-29 DIAGNOSIS — E119 Type 2 diabetes mellitus without complications: Secondary | ICD-10-CM | POA: Diagnosis not present

## 2014-12-29 DIAGNOSIS — M40204 Unspecified kyphosis, thoracic region: Secondary | ICD-10-CM | POA: Diagnosis not present

## 2014-12-29 DIAGNOSIS — Z0100 Encounter for examination of eyes and vision without abnormal findings: Secondary | ICD-10-CM | POA: Diagnosis not present

## 2014-12-29 DIAGNOSIS — M25561 Pain in right knee: Secondary | ICD-10-CM | POA: Diagnosis not present

## 2014-12-29 DIAGNOSIS — Z961 Presence of intraocular lens: Secondary | ICD-10-CM | POA: Diagnosis not present

## 2014-12-29 DIAGNOSIS — H43813 Vitreous degeneration, bilateral: Secondary | ICD-10-CM | POA: Diagnosis not present

## 2014-12-29 DIAGNOSIS — M79641 Pain in right hand: Secondary | ICD-10-CM | POA: Diagnosis not present

## 2015-03-31 DIAGNOSIS — Z1211 Encounter for screening for malignant neoplasm of colon: Secondary | ICD-10-CM | POA: Diagnosis not present

## 2015-04-19 DIAGNOSIS — K5730 Diverticulosis of large intestine without perforation or abscess without bleeding: Secondary | ICD-10-CM | POA: Diagnosis not present

## 2015-04-19 DIAGNOSIS — Z1211 Encounter for screening for malignant neoplasm of colon: Secondary | ICD-10-CM | POA: Diagnosis not present

## 2015-04-19 DIAGNOSIS — K649 Unspecified hemorrhoids: Secondary | ICD-10-CM | POA: Diagnosis not present

## 2015-04-19 DIAGNOSIS — Z7984 Long term (current) use of oral hypoglycemic drugs: Secondary | ICD-10-CM | POA: Diagnosis not present

## 2015-04-19 DIAGNOSIS — E119 Type 2 diabetes mellitus without complications: Secondary | ICD-10-CM | POA: Diagnosis not present

## 2015-07-12 DIAGNOSIS — Z01419 Encounter for gynecological examination (general) (routine) without abnormal findings: Secondary | ICD-10-CM | POA: Diagnosis not present

## 2015-07-12 DIAGNOSIS — E7800 Pure hypercholesterolemia, unspecified: Secondary | ICD-10-CM | POA: Diagnosis not present

## 2015-07-12 DIAGNOSIS — Z6831 Body mass index (BMI) 31.0-31.9, adult: Secondary | ICD-10-CM | POA: Diagnosis not present

## 2015-07-12 DIAGNOSIS — E119 Type 2 diabetes mellitus without complications: Secondary | ICD-10-CM | POA: Diagnosis not present

## 2015-07-12 DIAGNOSIS — I10 Essential (primary) hypertension: Secondary | ICD-10-CM | POA: Diagnosis not present

## 2015-07-12 DIAGNOSIS — Z124 Encounter for screening for malignant neoplasm of cervix: Secondary | ICD-10-CM | POA: Diagnosis not present

## 2015-07-12 DIAGNOSIS — Z7984 Long term (current) use of oral hypoglycemic drugs: Secondary | ICD-10-CM | POA: Diagnosis not present

## 2015-07-12 DIAGNOSIS — Z1272 Encounter for screening for malignant neoplasm of vagina: Secondary | ICD-10-CM | POA: Diagnosis not present

## 2015-11-07 DIAGNOSIS — Z23 Encounter for immunization: Secondary | ICD-10-CM | POA: Diagnosis not present

## 2015-11-24 DIAGNOSIS — L570 Actinic keratosis: Secondary | ICD-10-CM | POA: Diagnosis not present

## 2015-11-24 DIAGNOSIS — Z85820 Personal history of malignant melanoma of skin: Secondary | ICD-10-CM | POA: Diagnosis not present

## 2015-11-24 DIAGNOSIS — L821 Other seborrheic keratosis: Secondary | ICD-10-CM | POA: Diagnosis not present

## 2015-11-24 DIAGNOSIS — L919 Hypertrophic disorder of the skin, unspecified: Secondary | ICD-10-CM | POA: Diagnosis not present

## 2015-11-24 DIAGNOSIS — D1801 Hemangioma of skin and subcutaneous tissue: Secondary | ICD-10-CM | POA: Diagnosis not present

## 2015-12-26 ENCOUNTER — Other Ambulatory Visit: Payer: Self-pay | Admitting: Obstetrics & Gynecology

## 2015-12-26 DIAGNOSIS — Z1231 Encounter for screening mammogram for malignant neoplasm of breast: Secondary | ICD-10-CM

## 2015-12-26 DIAGNOSIS — Z9882 Breast implant status: Secondary | ICD-10-CM

## 2015-12-30 DIAGNOSIS — H43813 Vitreous degeneration, bilateral: Secondary | ICD-10-CM | POA: Diagnosis not present

## 2015-12-30 DIAGNOSIS — E119 Type 2 diabetes mellitus without complications: Secondary | ICD-10-CM | POA: Diagnosis not present

## 2015-12-30 DIAGNOSIS — Z961 Presence of intraocular lens: Secondary | ICD-10-CM | POA: Diagnosis not present

## 2015-12-30 DIAGNOSIS — H5212 Myopia, left eye: Secondary | ICD-10-CM | POA: Diagnosis not present

## 2016-01-03 ENCOUNTER — Ambulatory Visit: Payer: Self-pay | Admitting: Rheumatology

## 2016-01-06 DIAGNOSIS — H6123 Impacted cerumen, bilateral: Secondary | ICD-10-CM | POA: Diagnosis not present

## 2016-01-30 DIAGNOSIS — T753XXA Motion sickness, initial encounter: Secondary | ICD-10-CM | POA: Diagnosis not present

## 2016-01-30 DIAGNOSIS — H6122 Impacted cerumen, left ear: Secondary | ICD-10-CM | POA: Diagnosis not present

## 2016-01-31 ENCOUNTER — Ambulatory Visit: Payer: Medicare Other

## 2016-02-22 DIAGNOSIS — E119 Type 2 diabetes mellitus without complications: Secondary | ICD-10-CM | POA: Diagnosis not present

## 2016-02-22 DIAGNOSIS — I872 Venous insufficiency (chronic) (peripheral): Secondary | ICD-10-CM | POA: Diagnosis not present

## 2016-02-22 DIAGNOSIS — H9190 Unspecified hearing loss, unspecified ear: Secondary | ICD-10-CM | POA: Diagnosis not present

## 2016-02-22 DIAGNOSIS — R413 Other amnesia: Secondary | ICD-10-CM | POA: Diagnosis not present

## 2016-02-22 DIAGNOSIS — M159 Polyosteoarthritis, unspecified: Secondary | ICD-10-CM | POA: Diagnosis not present

## 2016-02-23 DIAGNOSIS — E119 Type 2 diabetes mellitus without complications: Secondary | ICD-10-CM | POA: Diagnosis not present

## 2016-02-23 DIAGNOSIS — R413 Other amnesia: Secondary | ICD-10-CM | POA: Diagnosis not present

## 2016-02-27 DIAGNOSIS — H903 Sensorineural hearing loss, bilateral: Secondary | ICD-10-CM | POA: Diagnosis not present

## 2016-03-07 DIAGNOSIS — R41841 Cognitive communication deficit: Secondary | ICD-10-CM | POA: Diagnosis not present

## 2016-03-07 DIAGNOSIS — R413 Other amnesia: Secondary | ICD-10-CM | POA: Diagnosis not present

## 2016-03-08 DIAGNOSIS — R413 Other amnesia: Secondary | ICD-10-CM | POA: Diagnosis not present

## 2016-03-08 DIAGNOSIS — R41841 Cognitive communication deficit: Secondary | ICD-10-CM | POA: Diagnosis not present

## 2016-03-14 ENCOUNTER — Ambulatory Visit: Payer: Medicare Other

## 2016-03-14 ENCOUNTER — Ambulatory Visit
Admission: RE | Admit: 2016-03-14 | Discharge: 2016-03-14 | Disposition: A | Discharge status: Home / Self Care | Payer: Medicare Other | Source: Ambulatory Visit | Attending: Obstetrics & Gynecology | Admitting: Obstetrics & Gynecology

## 2016-03-14 DIAGNOSIS — Z1231 Encounter for screening mammogram for malignant neoplasm of breast: Secondary | ICD-10-CM

## 2016-03-14 DIAGNOSIS — Z9882 Breast implant status: Secondary | ICD-10-CM

## 2016-03-28 DIAGNOSIS — F4322 Adjustment disorder with anxiety: Secondary | ICD-10-CM | POA: Diagnosis not present

## 2016-03-28 DIAGNOSIS — E119 Type 2 diabetes mellitus without complications: Secondary | ICD-10-CM | POA: Diagnosis not present

## 2016-06-19 DIAGNOSIS — M546 Pain in thoracic spine: Secondary | ICD-10-CM | POA: Diagnosis not present

## 2016-06-19 DIAGNOSIS — M9902 Segmental and somatic dysfunction of thoracic region: Secondary | ICD-10-CM | POA: Diagnosis not present

## 2016-06-22 DIAGNOSIS — M9902 Segmental and somatic dysfunction of thoracic region: Secondary | ICD-10-CM | POA: Diagnosis not present

## 2016-06-22 DIAGNOSIS — M546 Pain in thoracic spine: Secondary | ICD-10-CM | POA: Diagnosis not present

## 2016-07-17 ENCOUNTER — Telehealth (INDEPENDENT_AMBULATORY_CARE_PROVIDER_SITE_OTHER): Payer: Self-pay | Admitting: Rheumatology

## 2016-07-17 NOTE — Telephone Encounter (Signed)
[facility] RECORDS FAXED TO [place]

## 2016-07-30 DIAGNOSIS — Z87891 Personal history of nicotine dependence: Secondary | ICD-10-CM | POA: Diagnosis not present

## 2016-07-30 DIAGNOSIS — M19031 Primary osteoarthritis, right wrist: Secondary | ICD-10-CM | POA: Diagnosis not present

## 2016-07-30 DIAGNOSIS — M79641 Pain in right hand: Secondary | ICD-10-CM | POA: Diagnosis not present

## 2016-07-30 DIAGNOSIS — M79672 Pain in left foot: Secondary | ICD-10-CM | POA: Diagnosis not present

## 2016-07-30 DIAGNOSIS — E119 Type 2 diabetes mellitus without complications: Secondary | ICD-10-CM | POA: Diagnosis not present

## 2016-08-01 DIAGNOSIS — E119 Type 2 diabetes mellitus without complications: Secondary | ICD-10-CM | POA: Diagnosis not present

## 2016-08-08 DIAGNOSIS — Z6829 Body mass index (BMI) 29.0-29.9, adult: Secondary | ICD-10-CM | POA: Diagnosis not present

## 2016-08-08 DIAGNOSIS — Z01419 Encounter for gynecological examination (general) (routine) without abnormal findings: Secondary | ICD-10-CM | POA: Diagnosis not present

## 2016-09-19 DIAGNOSIS — I10 Essential (primary) hypertension: Secondary | ICD-10-CM | POA: Diagnosis not present

## 2016-09-19 DIAGNOSIS — I872 Venous insufficiency (chronic) (peripheral): Secondary | ICD-10-CM | POA: Diagnosis not present

## 2016-09-19 DIAGNOSIS — E119 Type 2 diabetes mellitus without complications: Secondary | ICD-10-CM | POA: Diagnosis not present

## 2016-09-24 DIAGNOSIS — Z23 Encounter for immunization: Secondary | ICD-10-CM | POA: Diagnosis not present

## 2016-12-03 DIAGNOSIS — B009 Herpesviral infection, unspecified: Secondary | ICD-10-CM | POA: Diagnosis not present

## 2016-12-03 DIAGNOSIS — D485 Neoplasm of uncertain behavior of skin: Secondary | ICD-10-CM | POA: Diagnosis not present

## 2016-12-03 DIAGNOSIS — Z85820 Personal history of malignant melanoma of skin: Secondary | ICD-10-CM | POA: Diagnosis not present

## 2016-12-03 DIAGNOSIS — L814 Other melanin hyperpigmentation: Secondary | ICD-10-CM | POA: Diagnosis not present

## 2016-12-03 DIAGNOSIS — L570 Actinic keratosis: Secondary | ICD-10-CM | POA: Diagnosis not present

## 2016-12-03 DIAGNOSIS — D1801 Hemangioma of skin and subcutaneous tissue: Secondary | ICD-10-CM | POA: Diagnosis not present

## 2016-12-03 DIAGNOSIS — L821 Other seborrheic keratosis: Secondary | ICD-10-CM | POA: Diagnosis not present

## 2016-12-17 DIAGNOSIS — M79641 Pain in right hand: Secondary | ICD-10-CM | POA: Diagnosis not present

## 2016-12-17 DIAGNOSIS — Z87891 Personal history of nicotine dependence: Secondary | ICD-10-CM | POA: Diagnosis not present

## 2016-12-17 DIAGNOSIS — M19031 Primary osteoarthritis, right wrist: Secondary | ICD-10-CM | POA: Diagnosis not present

## 2016-12-19 DIAGNOSIS — H26492 Other secondary cataract, left eye: Secondary | ICD-10-CM | POA: Diagnosis not present

## 2016-12-19 DIAGNOSIS — E119 Type 2 diabetes mellitus without complications: Secondary | ICD-10-CM | POA: Diagnosis not present

## 2016-12-19 DIAGNOSIS — H43813 Vitreous degeneration, bilateral: Secondary | ICD-10-CM | POA: Diagnosis not present

## 2016-12-19 DIAGNOSIS — H5212 Myopia, left eye: Secondary | ICD-10-CM | POA: Diagnosis not present

## 2017-02-06 ENCOUNTER — Other Ambulatory Visit: Payer: Self-pay | Admitting: Obstetrics & Gynecology

## 2017-02-06 DIAGNOSIS — Z1231 Encounter for screening mammogram for malignant neoplasm of breast: Secondary | ICD-10-CM

## 2017-04-03 ENCOUNTER — Ambulatory Visit
Admission: RE | Admit: 2017-04-03 | Discharge: 2017-04-03 | Disposition: A | Discharge status: Home / Self Care | Payer: Medicare Other | Source: Ambulatory Visit | Attending: Obstetrics & Gynecology | Admitting: Obstetrics & Gynecology

## 2017-04-03 DIAGNOSIS — Z1231 Encounter for screening mammogram for malignant neoplasm of breast: Secondary | ICD-10-CM

## 2018-03-03 ENCOUNTER — Other Ambulatory Visit: Payer: Self-pay | Admitting: Obstetrics & Gynecology

## 2018-03-03 DIAGNOSIS — Z1231 Encounter for screening mammogram for malignant neoplasm of breast: Secondary | ICD-10-CM

## 2018-04-09 ENCOUNTER — Ambulatory Visit: Payer: Medicare Other

## 2018-04-10 ENCOUNTER — Ambulatory Visit: Payer: Medicare Other

## 2018-05-14 ENCOUNTER — Ambulatory Visit: Payer: Medicare Other

## 2018-06-17 ENCOUNTER — Other Ambulatory Visit: Payer: Self-pay

## 2018-06-17 ENCOUNTER — Ambulatory Visit
Admission: RE | Admit: 2018-06-17 | Discharge: 2018-06-17 | Disposition: A | Discharge status: Home / Self Care | Payer: Medicare Other | Source: Ambulatory Visit | Attending: Obstetrics & Gynecology | Admitting: Obstetrics & Gynecology

## 2018-06-17 DIAGNOSIS — Z1231 Encounter for screening mammogram for malignant neoplasm of breast: Secondary | ICD-10-CM

## 2019-05-18 ENCOUNTER — Other Ambulatory Visit: Payer: Self-pay | Admitting: Obstetrics & Gynecology

## 2019-05-18 DIAGNOSIS — Z1231 Encounter for screening mammogram for malignant neoplasm of breast: Secondary | ICD-10-CM

## 2019-06-24 ENCOUNTER — Other Ambulatory Visit: Payer: Self-pay

## 2019-06-24 ENCOUNTER — Ambulatory Visit
Admission: RE | Admit: 2019-06-24 | Discharge: 2019-06-24 | Disposition: A | Discharge status: Home / Self Care | Payer: Medicare Other | Source: Ambulatory Visit | Attending: Obstetrics & Gynecology | Admitting: Obstetrics & Gynecology

## 2019-06-24 DIAGNOSIS — Z1231 Encounter for screening mammogram for malignant neoplasm of breast: Secondary | ICD-10-CM

## 2020-06-03 ENCOUNTER — Other Ambulatory Visit: Payer: Self-pay | Admitting: Internal Medicine

## 2020-06-03 DIAGNOSIS — Z1231 Encounter for screening mammogram for malignant neoplasm of breast: Secondary | ICD-10-CM

## 2020-08-17 ENCOUNTER — Ambulatory Visit: Payer: Medicare Other

## 2020-10-06 ENCOUNTER — Ambulatory Visit: Payer: Medicare Other

## 2020-11-01 ENCOUNTER — Ambulatory Visit: Payer: Medicare Other

## 2020-11-15 ENCOUNTER — Other Ambulatory Visit: Payer: Self-pay

## 2020-11-15 ENCOUNTER — Ambulatory Visit
Admission: RE | Admit: 2020-11-15 | Discharge: 2020-11-15 | Disposition: A | Discharge status: Home / Self Care | Payer: Medicare Other | Source: Ambulatory Visit | Attending: Internal Medicine | Admitting: Internal Medicine

## 2020-11-15 DIAGNOSIS — Z1231 Encounter for screening mammogram for malignant neoplasm of breast: Secondary | ICD-10-CM

## 2023-02-09 IMAGING — MG DIGITAL SCREENING BREAST BILAT IMPLANT W/ TOMO W/ CAD
8 of 12 series · 8 of 28 positions shown · non-contrast
Comparison: Previous exams.

CLINICAL DATA: Screening.

EXAM:
DIGITAL SCREENING BILATERAL MAMMOGRAM WITH IMPLANTS, CAD AND
TOMOSYNTHESIS
TECHNIQUE: Bilateral screening digital craniocaudal and mediolateral oblique
mammograms were obtained. Bilateral screening digital breast
tomosynthesis was performed. The images were evaluated with
computer-aided detection. Standard and/or implant displaced views
were performed.

[L CC]
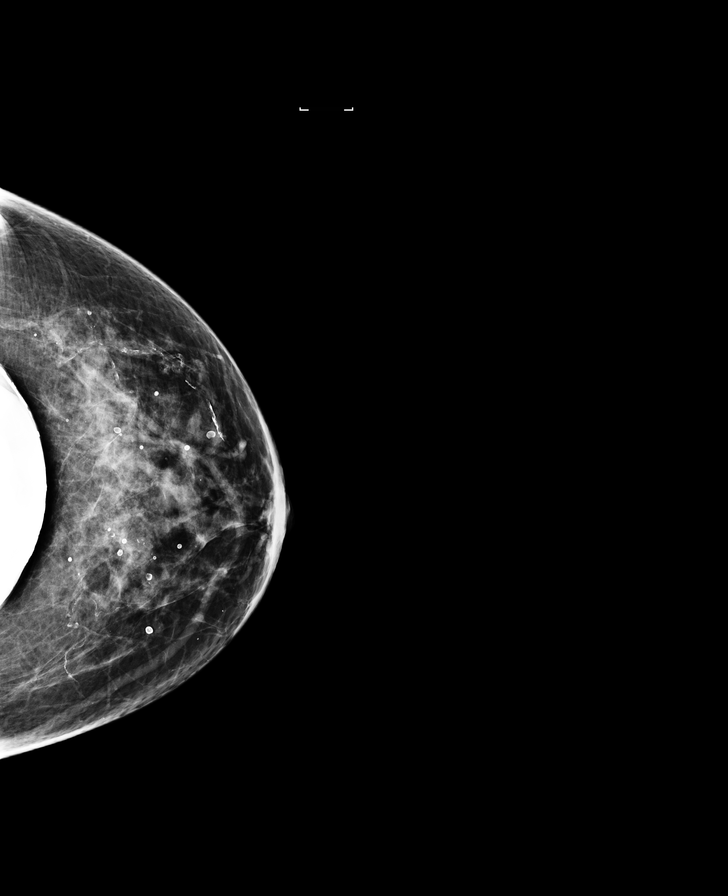

[L MLO]
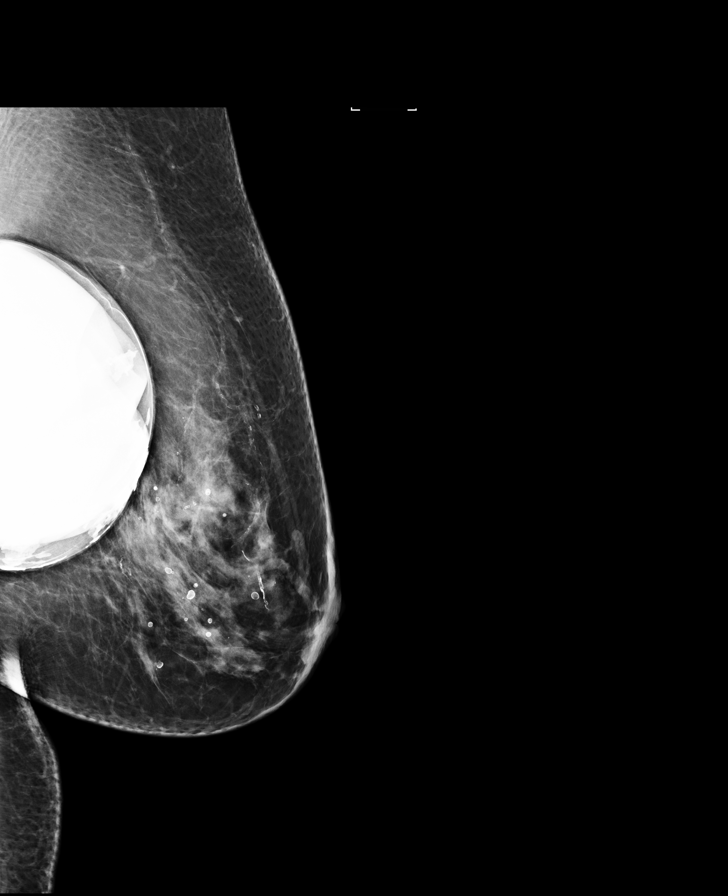

[R CC]
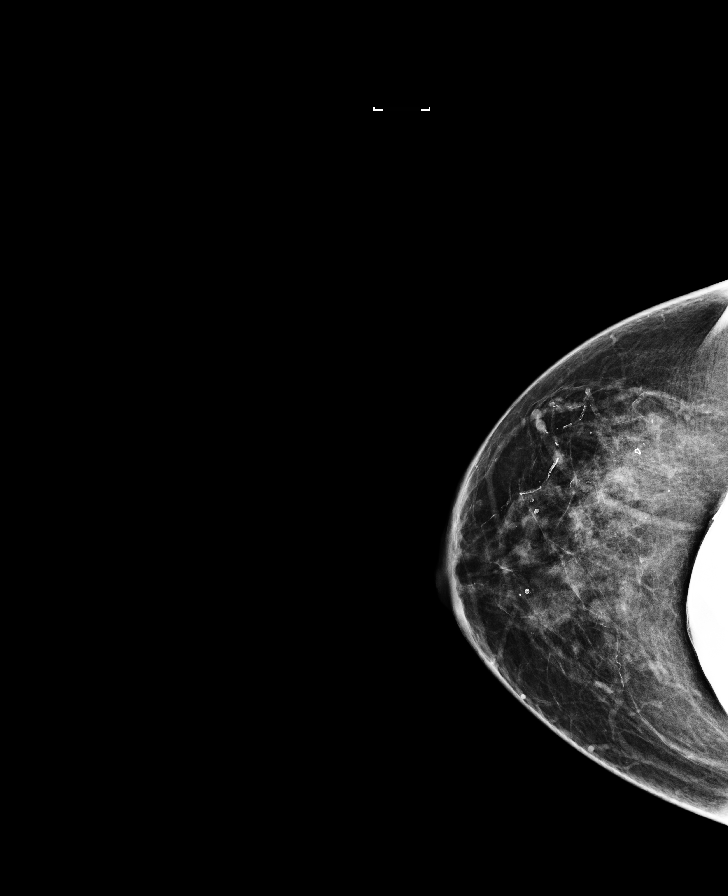

[R MLO]
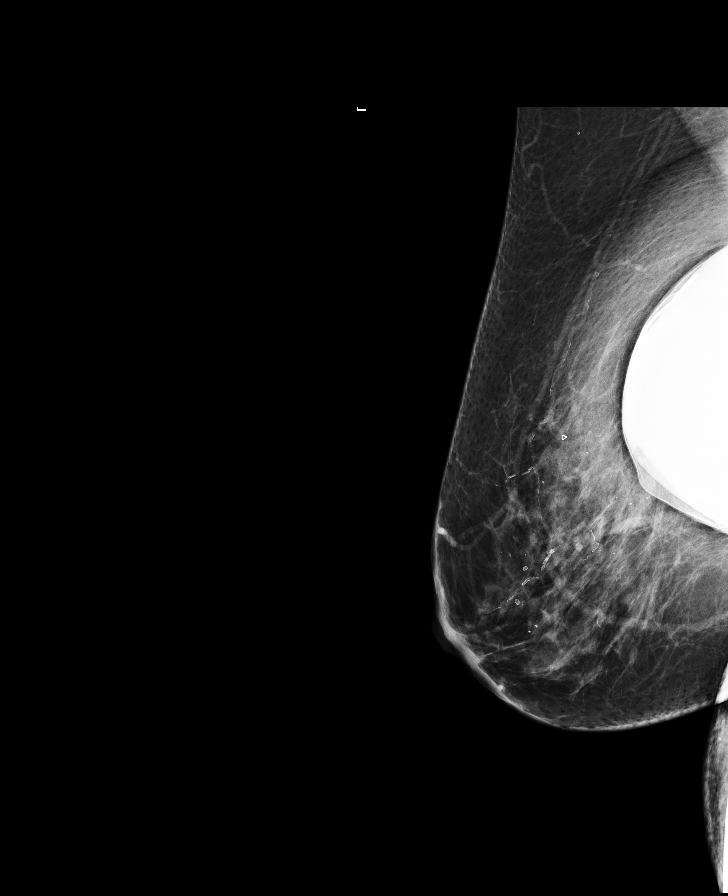

[L CC synth-2D]
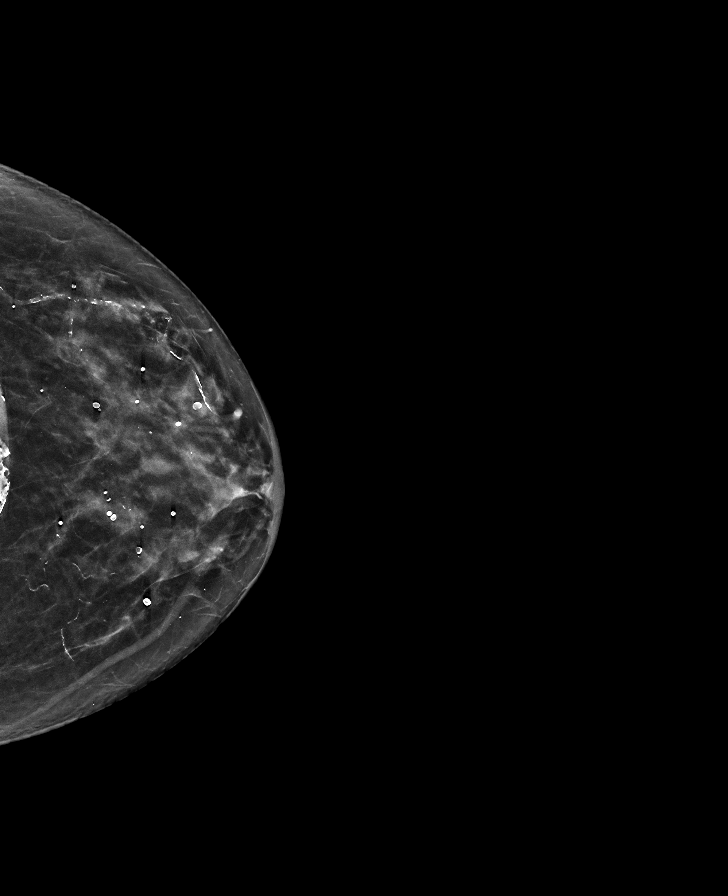

[R CC synth-2D]
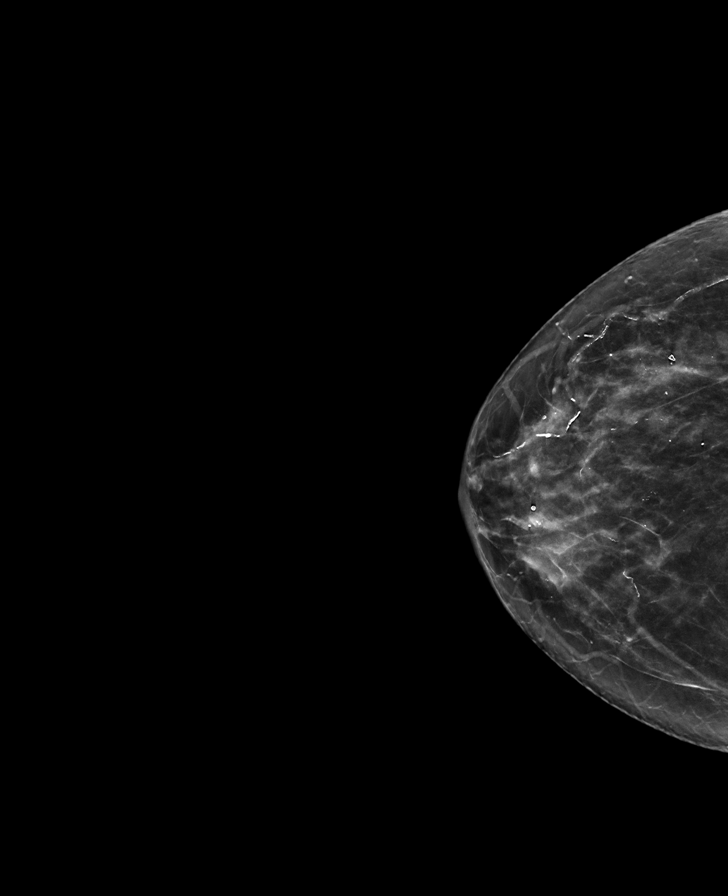

[R MLO synth-2D]
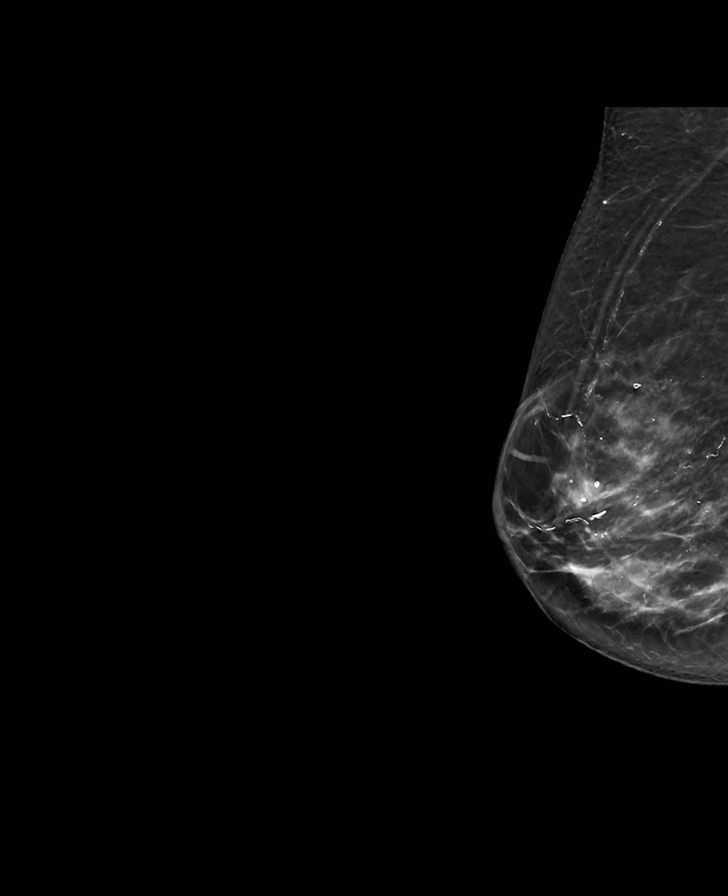

[L MLO synth-2D]
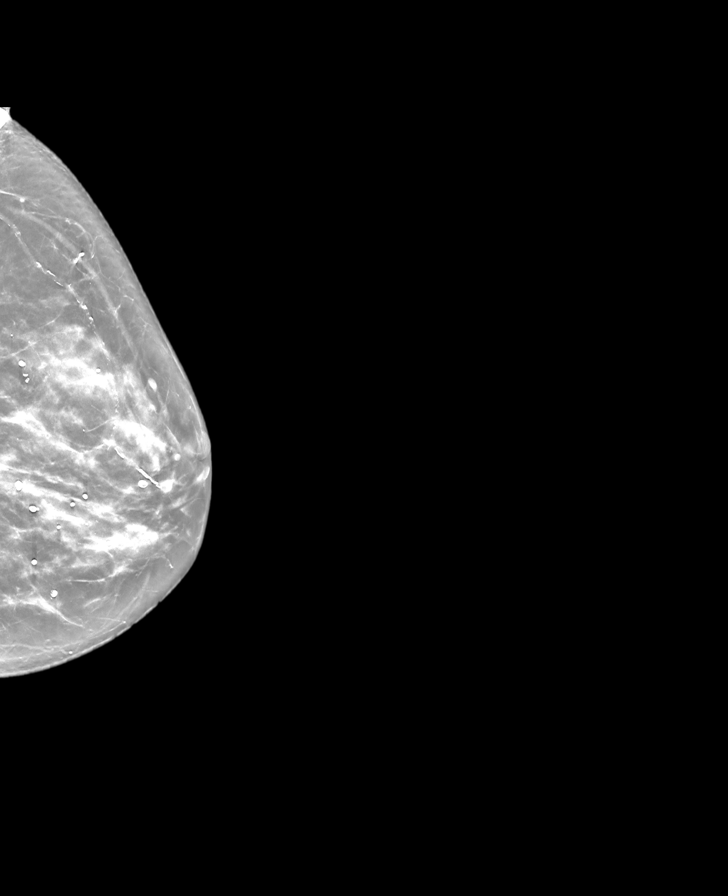

[8 of 28 positions shown; findings below may reference images not displayed]

ACR Breast Density Category c: The breast tissue is heterogeneously
dense, which may obscure small masses.
FINDINGS: The patient has bilateral prepectoral implants. There are no
findings suspicious for malignancy.
IMPRESSION: No mammographic evidence of malignancy. A result letter of this
screening mammogram will be mailed directly to the patient.

RECOMMENDATION:
Screening mammogram in one year. (Code:4L-S-9EF)

BI-RADS CATEGORY  1:  Negative.

## 2024-02-16 DEATH — deceased
